# Patient Record
Sex: Female | Born: 1990 | Race: Black or African American | Hispanic: No | Marital: Single | Smoking: Current some day smoker
Health system: Southern US, Community
[De-identification: ages and names within clinical notes are randomized; demographics above are authoritative.]

## PROBLEM LIST (undated history)

## (undated) ENCOUNTER — Ambulatory Visit (HOSPITAL_COMMUNITY): Admission: EM | Payer: No Typology Code available for payment source

---

## 2012-07-28 ENCOUNTER — Encounter (HOSPITAL_COMMUNITY): Payer: Self-pay | Admitting: *Deleted

## 2012-07-28 ENCOUNTER — Emergency Department (HOSPITAL_COMMUNITY): Payer: Medicaid Other

## 2012-07-28 ENCOUNTER — Emergency Department (HOSPITAL_COMMUNITY)
Admission: EM | Admit: 2012-07-28 | Discharge: 2012-07-28 | Disposition: A | Discharge status: Home / Self Care | Payer: Medicaid Other | Attending: Emergency Medicine | Admitting: Emergency Medicine

## 2012-07-28 DIAGNOSIS — R51 Headache: Secondary | ICD-10-CM | POA: Insufficient documentation

## 2012-07-28 DIAGNOSIS — R0602 Shortness of breath: Secondary | ICD-10-CM | POA: Insufficient documentation

## 2012-07-28 DIAGNOSIS — B349 Viral infection, unspecified: Secondary | ICD-10-CM

## 2012-07-28 DIAGNOSIS — B9789 Other viral agents as the cause of diseases classified elsewhere: Secondary | ICD-10-CM | POA: Insufficient documentation

## 2012-07-28 DIAGNOSIS — R5381 Other malaise: Secondary | ICD-10-CM | POA: Insufficient documentation

## 2012-07-28 DIAGNOSIS — R110 Nausea: Secondary | ICD-10-CM | POA: Insufficient documentation

## 2012-07-28 DIAGNOSIS — J3489 Other specified disorders of nose and nasal sinuses: Secondary | ICD-10-CM | POA: Insufficient documentation

## 2012-07-28 DIAGNOSIS — M549 Dorsalgia, unspecified: Secondary | ICD-10-CM | POA: Insufficient documentation

## 2012-07-28 DIAGNOSIS — R05 Cough: Secondary | ICD-10-CM | POA: Insufficient documentation

## 2012-07-28 DIAGNOSIS — R059 Cough, unspecified: Secondary | ICD-10-CM | POA: Insufficient documentation

## 2012-07-28 DIAGNOSIS — R6883 Chills (without fever): Secondary | ICD-10-CM | POA: Insufficient documentation

## 2012-07-28 DIAGNOSIS — Z3202 Encounter for pregnancy test, result negative: Secondary | ICD-10-CM | POA: Insufficient documentation

## 2012-07-28 DIAGNOSIS — J029 Acute pharyngitis, unspecified: Secondary | ICD-10-CM | POA: Insufficient documentation

## 2012-07-28 LAB — CBC WITH DIFFERENTIAL/PLATELET
Basophils Relative: 1 % (ref 0–1)
Eosinophils Absolute: 0.2 10*3/uL (ref 0.0–0.7)
Lymphs Abs: 1.2 10*3/uL (ref 0.7–4.0)
MCH: 29.6 pg (ref 26.0–34.0)
Neutrophils Relative %: 60 % (ref 43–77)
Platelets: 215 10*3/uL (ref 150–400)
RBC: 4.15 MIL/uL (ref 3.87–5.11)
WBC: 4.5 10*3/uL (ref 4.0–10.5)

## 2012-07-28 LAB — BASIC METABOLIC PANEL
GFR calc Af Amer: >90 mL/min (ref >90)
GFR calc non Af Amer: >90 mL/min (ref >90)
Glucose, Bld: 84 mg/dL (ref 70–99)
Potassium: 3.6 mEq/L (ref 3.5–5.1)
Sodium: 140 mEq/L (ref 135–145)

## 2012-07-28 LAB — URINALYSIS, ROUTINE W REFLEX MICROSCOPIC
Bilirubin Urine: NEGATIVE
Glucose, UA: NEGATIVE mg/dL
Hgb urine dipstick: NEGATIVE
Ketones, ur: NEGATIVE mg/dL
Protein, ur: NEGATIVE mg/dL

## 2012-07-28 LAB — URINE MICROSCOPIC-ADD ON

## 2012-07-28 LAB — POCT PREGNANCY, URINE: Preg Test, Ur: NEGATIVE

## 2012-07-28 LAB — MONONUCLEOSIS SCREEN: Mono Screen: NEGATIVE

## 2012-07-28 MED ORDER — HYDROCODONE-ACETAMINOPHEN 5-325 MG PO TABS
1.0000 | ORAL_TABLET | Freq: Once | ORAL | Status: AC
  Administered 2012-07-28: 1 via ORAL
  Filled 2012-07-28: qty 1

## 2012-07-28 MED ORDER — ONDANSETRON HCL 4 MG/2ML IJ SOLN: 4.0000 mg | Freq: Once | INTRAMUSCULAR | Status: DC

## 2012-07-28 MED ORDER — MORPHINE SULFATE 4 MG/ML IJ SOLN: 4.0000 mg | Freq: Once | INTRAMUSCULAR | Status: DC

## 2012-07-28 MED ORDER — ONDANSETRON 4 MG PO TBDP
4.0000 mg | ORAL_TABLET | Freq: Once | ORAL | Status: AC
  Administered 2012-07-28: 4 mg via ORAL
  Filled 2012-07-28: qty 1

## 2012-07-28 NOTE — ED Provider Notes (Signed)
History     CSN: [id number]  Arrival date & time 07/28/12  1407   First MD Initiated Contact with Patient 07/28/12 1528     Chief Complaint  Patient presents with  . Abdominal Pain   HPI: Alyssa Wells is a 21 yo AAF with no pertinent history presents with intermittent abdominal pain, headache, nasal congestion, sore throat and cough. Symptoms started two weeks ago with nasal congestion, sore throat and mild intermittent headache. She also endorses more fatigue since this period of time. She has had a non-productive cough. She continues to tolerate PO with some intermittent nausea but no vomiting. Over the last several days she has had intermittent LUQ pain she describes as sharp, lasts 5 minutes then resolves spontaneously. She denies vaginal discharge or vaginal bleeding that is abnormal. Further no syncopal or near-syncopal episodes.   No past medical history on file.  No past surgical history on file.  No family history on file.  History  Substance Use Topics  . Smoking status: Not on file  . Smokeless tobacco: Not on file  . Alcohol Use: Not on file    OB History    No data available      Review of Systems  Constitutional: Positive for chills and fatigue. Negative for fever and appetite change.  HENT: Positive for congestion (nasal) and sore throat. Negative for rhinorrhea and trouble swallowing.   Eyes: Negative for photophobia and visual disturbance.  Respiratory: Positive for cough. Negative for chest tightness, shortness of breath and wheezing.   Cardiovascular: Positive for chest pain. Negative for palpitations and leg swelling.  Gastrointestinal: Positive for nausea and abdominal pain. Negative for vomiting and constipation.  Genitourinary: Negative for dysuria, urgency and decreased urine volume.  Musculoskeletal: Negative for myalgias, back pain and arthralgias.  Skin: Negative for pallor and rash.  Neurological: Negative for dizziness, weakness and headaches.   Psychiatric/Behavioral: Negative for confusion and agitation.    Allergies  Review of patient's allergies indicates not on file.  Home Medications  No current outpatient prescriptions on file.  BP 132/81  Pulse 90  Temp 98.1 F (36.7 C) (Oral)  Resp 18  SpO2 98%  LMP 05/04/2012  Physical Exam  Nursing note and vitals reviewed. Constitutional: She is oriented to person, place, and time. She appears well-developed and well-nourished. She is cooperative. No distress.  HENT:  Head: Normocephalic and atraumatic.  Nose: Mucosal edema present. Right sinus exhibits no maxillary sinus tenderness and no frontal sinus tenderness. Left sinus exhibits no maxillary sinus tenderness and no frontal sinus tenderness.  Mouth/Throat: Mucous membranes are normal. Posterior oropharyngeal erythema (mild) present.  Eyes: Conjunctivae normal and EOM are normal. Pupils are equal, round, and reactive to light.  Neck: Normal range of motion. Neck supple. No JVD present.  Cardiovascular: Normal rate, regular rhythm, S1 normal, S2 normal and normal heart sounds.  Exam reveals no decreased pulses.   Pulmonary/Chest: Effort normal and breath sounds normal. She has no decreased breath sounds.  Abdominal: Soft. Normal appearance and bowel sounds are normal. There is tenderness in the left upper quadrant. There is no rebound, no guarding and no CVA tenderness.  Musculoskeletal: Normal range of motion. She exhibits no edema.  Neurological: She is alert and oriented to person, place, and time. No cranial nerve deficit.  Skin: Skin is warm and dry.   ED Course  Procedures  Labs Reviewed  URINALYSIS, ROUTINE W REFLEX MICROSCOPIC - Abnormal; Notable for the following:    Urobilinogen, UA 2.0 (*)  Leukocytes, UA TRACE (*)     All other components within normal limits  POCT PREGNANCY, URINE  URINE MICROSCOPIC-ADD ON  MONONUCLEOSIS SCREEN  CBC WITH DIFFERENTIAL  BASIC METABOLIC PANEL   MDM  21 yo AAF with  no pertinent history presents with intermittent abdominal pain, headache, nasal congestion, sore throat and cough. UA not c/w infection as no WBC or bacteria in UA. Pregnancy test negative. Mild TTP in LUQ but no peritoneal signs, no imaging required. No lower abdominal symptoms, discharge or bleeding to suggest PID. Doubt PE as low risk by Wells', not tachycardic or hypoxic. Labs without abnormality. Mono spot negative. CXR without acute disease. Obtained ECG due to intermittent CP, no WPW, prolonged QT or Brugada. Chest pain likely MSK from coughing. Symptoms likely viral in nature as she has had URI symptoms. Doubt strep as no exudates, LAD, fever, plus she has cough. No further w/u indicated in the ED. Advised symptomatic treatment and follow-up with her PCP in 3-5 days if symptoms persist. Return precautions given  ECG: SB, rate 58, normal axis, normal ECG. No previous for comparison.   Reviewed labs, imaging and ECG, utilized MDM  Discussed case with Dr. Ethelda Chick  Clinical Impression 1. Viral illness      Margie Billet, MD 07/28/12 [phone]

## 2012-07-28 NOTE — ED Provider Notes (Signed)
Complains of sore throat headache slight cough, for 2 weeks admits to nasal congestion denies shortness of breath treated with Tylenol. Also complains of left-sided abdominal pain intermittently approximately 2 weeks . Abdominal pain lasts for possibly 5 minutes at a time worse with lying in certain positions and improved with other positions pain is minimal at present on exam patient is alert nontoxic appearing. HEENT exam positive nasal congestion oropharynx is reddened no exudate uvula midline neck supple lungs clear auscultation heart regular rate and rhythm abdomen minimally tender left upper and left lower quadrants no guarding no splenomegaly  Doug Sou, MD 07/28/12 1559

## 2012-07-28 NOTE — ED Notes (Signed)
Pt. Stated, I've had abd. Pain along with a headache, back pain and some short of breath pain for 2 weeks.

## 2012-07-29 NOTE — ED Provider Notes (Signed)
I have personally seen and examined the patient.  I have discussed the plan of care with the resident.  I have reviewed the documentation on PMH/FH/Soc. History.  I have reviewed the documentation of the resident and agree.  Doug Sou, MD 07/29/12 [phone]

## 2013-01-03 ENCOUNTER — Encounter (HOSPITAL_COMMUNITY): Payer: Self-pay | Admitting: Emergency Medicine

## 2013-01-03 DIAGNOSIS — Z3202 Encounter for pregnancy test, result negative: Secondary | ICD-10-CM | POA: Insufficient documentation

## 2013-01-03 DIAGNOSIS — R197 Diarrhea, unspecified: Secondary | ICD-10-CM | POA: Insufficient documentation

## 2013-01-03 LAB — URINALYSIS, ROUTINE W REFLEX MICROSCOPIC
Nitrite: NEGATIVE
Specific Gravity, Urine: 1.033 — ABNORMAL HIGH (ref 1.005–1.030)
Urobilinogen, UA: 1 mg/dL (ref 0.0–1.0)

## 2013-01-03 LAB — URINE MICROSCOPIC-ADD ON

## 2013-01-03 LAB — COMPREHENSIVE METABOLIC PANEL
ALT: 12 U/L (ref 0–35)
Alkaline Phosphatase: 50 U/L (ref 39–117)
CO2: 28 mEq/L (ref 19–32)
GFR calc Af Amer: 79 mL/min — ABNORMAL LOW (ref >90)
GFR calc non Af Amer: 68 mL/min — ABNORMAL LOW (ref >90)
Glucose, Bld: 90 mg/dL (ref 70–99)
Potassium: 4.2 mEq/L (ref 3.5–5.1)
Sodium: 141 mEq/L (ref 135–145)
Total Protein: 8.3 g/dL (ref 6.0–8.3)

## 2013-01-03 LAB — CBC WITH DIFFERENTIAL/PLATELET
Lymphocytes Relative: 16 % (ref 12–46)
Lymphs Abs: 1.1 10*3/uL (ref 0.7–4.0)
Neutrophils Relative %: 78 % — ABNORMAL HIGH (ref 43–77)
Platelets: 219 10*3/uL (ref 150–400)
RBC: 4.65 MIL/uL (ref 3.87–5.11)
WBC: 7.2 10*3/uL (ref 4.0–10.5)

## 2013-01-03 NOTE — ED Notes (Signed)
PT. REPORTS EMESIS ,  DIARRHEA , CHILLS AND LEFT LOWER ABDOMINAL CRAMPING ONSET YESTERDAY .

## 2013-01-03 NOTE — ED Notes (Signed)
Pt requesting d/c paperwork, stating she has to go to work and needs a note.  Informed patient d/c papers are provided only if the patient is seen by a doctor.  Pt requesting to speak to someone else about getting paperwork.  Shary Key, RN aware and spoke with patient also.

## 2013-01-03 NOTE — ED Notes (Signed)
Pt came up to nurse first and asked about wait time and how many pt in front of her. Pt told that she still had several people in front of her, to which she replied that she needed to be at work by midnight and was going to leave.

## 2013-01-04 ENCOUNTER — Emergency Department (HOSPITAL_COMMUNITY)
Admission: EM | Admit: 2013-01-04 | Discharge: 2013-01-04 | Disposition: A | Discharge status: Home / Self Care | Payer: Medicaid Other | Attending: Emergency Medicine | Admitting: Emergency Medicine

## 2013-01-04 NOTE — ED Notes (Signed)
Pt called for vital recheck with no answer 

## 2013-01-05 LAB — URINE CULTURE

## 2013-01-06 NOTE — ED Notes (Signed)
Post ED Visit - Positive Culture Follow-up: Successful Patient Follow-Up  Culture assessed and recommendations reviewed by: []  Wes Dulaney, Pharm.D., BCPS []  Celedonio Miyamoto, Pharm.D., BCPS []  Georgina Pillion, Pharm.D., BCPS [x]  [place], [address].D., BCPS, AAHIVP []  Estella Husk, Pharm.D., BCPS, AAHIVP  Positive urine culture  [x]  Patient discharged without antimicrobial prescription and treatment is now indicated []  Organism is resistant to prescribed ED discharge antimicrobial []  Patient with positive blood cultures  Changes discussed with ED provider: Roxy Horseman New antibiotic prescription Keflex 500 mg TID x 5 days    Contacted patient: No answer  Larena Sox 01/06/2013, 6:01 PM

## 2013-01-06 NOTE — Progress Notes (Signed)
ED Antimicrobial Stewardship Positive Culture Follow Up   Alyssa Wells is an 22 y.o. female who presented to [facility] on 01/04/2013 with a chief complaint of nausea, abd pain Chief Complaint  Patient presents with  . Emesis  . Diarrhea    Recent Results (from the past 720 hour(s))  URINE CULTURE     Status: None   Collection Time    01/03/13  8:36 PM      Result Value Range Status   Specimen Description URINE, CLEAN CATCH   Final   Special Requests NONE   Final   Culture  Setup Time 01/04/2013 03:08   Final   Colony Count 40,000 COLONIES/ML   Final   Culture     Final   Value: GROUP B STREP(S.AGALACTIAE)ISOLATED     Note: TESTING AGAINST S. AGALACTIAE NOT ROUTINELY PERFORMED DUE TO PREDICTABILITY OF AMP/PEN/VAN SUSCEPTIBILITY.   Report Status 01/05/2013 FINAL   Final    []  Treated with , organism resistant to prescribed antimicrobial [x]  Patient discharged originally without antimicrobial agent and treatment is now indicated. Her UA show turbid appearance. She was not seen by MD before dc. Flow manager will call pt and if she is symptomatic then keflex 500mg  TID x5 days will be prescribed.     ED Provider: Ivar Drape   Ulyses Southward [place] 01/06/2013, 12:13 PM Infectious Diseases Pharmacist Phone# [phone]

## 2013-01-07 ENCOUNTER — Telehealth (HOSPITAL_COMMUNITY): Payer: Self-pay | Admitting: Emergency Medicine

## 2013-03-13 ENCOUNTER — Emergency Department (HOSPITAL_COMMUNITY): Payer: Medicaid Other

## 2013-03-13 ENCOUNTER — Encounter (HOSPITAL_COMMUNITY): Payer: Self-pay | Admitting: *Deleted

## 2013-03-13 ENCOUNTER — Emergency Department (HOSPITAL_COMMUNITY)
Admission: EM | Admit: 2013-03-13 | Discharge: 2013-03-13 | Discharge status: Against Medical Advice | Payer: Medicaid Other | Attending: Emergency Medicine | Admitting: Emergency Medicine

## 2013-03-13 DIAGNOSIS — Y92410 Unspecified street and highway as the place of occurrence of the external cause: Secondary | ICD-10-CM | POA: Insufficient documentation

## 2013-03-13 DIAGNOSIS — S298XXA Other specified injuries of thorax, initial encounter: Secondary | ICD-10-CM | POA: Insufficient documentation

## 2013-03-13 DIAGNOSIS — Y939 Activity, unspecified: Secondary | ICD-10-CM | POA: Insufficient documentation

## 2013-03-13 NOTE — ED Notes (Signed)
UNABLE TO LOCATE PT 
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
## 2013-03-13 NOTE — ED Notes (Signed)
Pt returned from xray

## 2014-04-12 ENCOUNTER — Emergency Department (HOSPITAL_COMMUNITY)
Admission: EM | Admit: 2014-04-12 | Discharge: 2014-04-13 | Disposition: A | Discharge status: Home / Self Care | Payer: No Typology Code available for payment source | Attending: Emergency Medicine | Admitting: Emergency Medicine

## 2014-04-12 ENCOUNTER — Encounter (HOSPITAL_COMMUNITY): Payer: Self-pay | Admitting: Emergency Medicine

## 2014-04-12 DIAGNOSIS — Z79899 Other long term (current) drug therapy: Secondary | ICD-10-CM | POA: Insufficient documentation

## 2014-04-12 DIAGNOSIS — R51 Headache: Secondary | ICD-10-CM | POA: Insufficient documentation

## 2014-04-12 DIAGNOSIS — Z3202 Encounter for pregnancy test, result negative: Secondary | ICD-10-CM | POA: Insufficient documentation

## 2014-04-12 DIAGNOSIS — R112 Nausea with vomiting, unspecified: Secondary | ICD-10-CM | POA: Insufficient documentation

## 2014-04-12 DIAGNOSIS — R519 Headache, unspecified: Secondary | ICD-10-CM

## 2014-04-12 DIAGNOSIS — F17200 Nicotine dependence, unspecified, uncomplicated: Secondary | ICD-10-CM | POA: Insufficient documentation

## 2014-04-12 NOTE — ED Notes (Signed)
Pt c/o migraine with n/v starting today while at work. Emesis x 1.

## 2014-04-13 LAB — I-STAT CHEM 8, ED
BUN: 7 mg/dL (ref 6–23)
CHLORIDE: 103 meq/L (ref 96–112)
Calcium, Ion: 1.2 mmol/L (ref 1.12–1.23)
Creatinine, Ser: 0.8 mg/dL (ref 0.50–1.10)
Glucose, Bld: 108 mg/dL — ABNORMAL HIGH (ref 70–99)
HEMATOCRIT: 35 % — AB (ref 36.0–46.0)
HEMOGLOBIN: 11.9 g/dL — AB (ref 12.0–15.0)
POTASSIUM: 3.3 meq/L — AB (ref 3.7–5.3)
Sodium: 139 mEq/L (ref 137–147)
TCO2: 25 mmol/L (ref 0–100)

## 2014-04-13 LAB — POC URINE PREG, ED: PREG TEST UR: NEGATIVE

## 2014-04-13 MED ORDER — NAPROXEN 500 MG PO TABS: 500.0000 mg | ORAL_TABLET | Freq: Two times a day (BID) | ORAL | Status: DC

## 2014-04-13 MED ORDER — METOCLOPRAMIDE HCL 5 MG/ML IJ SOLN
10.0000 mg | Freq: Once | INTRAMUSCULAR | Status: AC
  Administered 2014-04-13: 10 mg via INTRAVENOUS
  Filled 2014-04-13: qty 2

## 2014-04-13 MED ORDER — SODIUM CHLORIDE 0.9 % IV BOLUS (SEPSIS)
1000.0000 mL | Freq: Once | INTRAVENOUS | Status: AC
  Administered 2014-04-13: 1000 mL via INTRAVENOUS

## 2014-04-13 MED ORDER — KETOROLAC TROMETHAMINE 30 MG/ML IJ SOLN
30.0000 mg | Freq: Once | INTRAMUSCULAR | Status: AC
  Administered 2014-04-13: 30 mg via INTRAVENOUS
  Filled 2014-04-13: qty 1

## 2014-04-13 MED ORDER — MORPHINE SULFATE 4 MG/ML IJ SOLN
4.0000 mg | Freq: Once | INTRAMUSCULAR | Status: AC
  Administered 2014-04-13: 4 mg via INTRAVENOUS
  Filled 2014-04-13: qty 1

## 2014-04-13 NOTE — ED Provider Notes (Signed)
CSN: [id number]     Arrival date & time 04/12/14  2325 History   First MD Initiated Contact with Patient 04/12/14 2334     Chief Complaint  Patient presents with  . Emesis  . Migraine      The history is provided by the patient.   patient presents to emergency department with nausea and vomiting and headache which began today.  She denies fevers or chills.  She denies neck pain or neck stiffness.  No recent illness.  No recent trauma to her head.  She states she has a history of headaches but this is more severe than usual.  She has photophobia and phonophobia.  She states normally she will try Tylenol with resolution of her headache.  Tonight this did not work.  She presents the emergency department for evaluation.  She reports irregular menstrual cycles and reports that is normal for her.  She denies abdominal pain.  No active vaginal bleeding.  Denies chest pain.  No shortness of breath.  No other complaints.  No new rash.    No past medical history on file. No past surgical history on file. No family history on file. History  Substance Use Topics  . Smoking status: Current Every Day Smoker -- 0.50 packs/day    Types: Cigars  . Smokeless tobacco: Not on file  . Alcohol Use: No   OB History   Grav Para Term Preterm Abortions TAB SAB Ect Mult Living                 Review of Systems  Gastrointestinal: Positive for vomiting.  All other systems reviewed and are negative.     Allergies  Strawberry  Home Medications   Prior to Admission medications   Medication Sig Start Date End Date Taking? Authorizing Provider  acetaminophen (TYLENOL) 500 MG tablet Take 1,000 mg by mouth every 6 (six) hours as needed for headache.    Yes Historical Provider, MD  Prenatal Vit-Fe Fumarate-FA (MULTIVITAMIN-PRENATAL) 27-0.8 MG TABS Take 1 tablet by mouth daily at 12 noon.   Yes Historical Provider, MD   BP 131/75  Pulse 98  Temp(Src) 98.7 F (37.1 C) (Oral)  Resp 16  Ht  (1.626 m)   Wt 120 lb (54.432 kg)  BMI 20.59 kg/m2  SpO2 98%  LMP 03/19/2014 Physical Exam  Nursing note and vitals reviewed. Constitutional: She is oriented to person, place, and time. She appears well-developed and well-nourished. No distress.  HENT:  Head: Normocephalic and atraumatic.  Eyes: EOM are normal. Pupils are equal, round, and reactive to light.  Neck: Normal range of motion.  Cardiovascular: Normal rate, regular rhythm and normal heart sounds.   Pulmonary/Chest: Effort normal and breath sounds normal.  Abdominal: Soft. She exhibits no distension. There is no tenderness.  Musculoskeletal: Normal range of motion.  Neurological: She is alert and oriented to person, place, and time.  5/5 strength in major muscle groups of  bilateral upper and lower extremities. Speech normal. No facial asymetry.   Skin: Skin is warm and dry.  Psychiatric: She has a normal mood and affect. Judgment normal.    ED Course  Procedures (including critical care time) Labs Review Labs Reviewed  I-STAT CHEM 8, ED - Abnormal; Notable for the following:    Potassium 3.3 (*)    Glucose, Bld 108 (*)    Hemoglobin 11.9 (*)    HCT 35.0 (*)    All other components within normal limits  POC URINE PREG, ED  Imaging Review No results found.   EKG Interpretation None      MDM   Final diagnoses:  Headache, unspecified headache type    Patient feels much better this time.  Acute headache.  Doubt subarachnoid hemorrhage.  Well-appearing.  Discharge home in good condition.    Lyanne Co, MD 04/13/14 [phone]

## 2014-04-13 NOTE — ED Notes (Signed)
Nad at this time.

## 2014-05-10 ENCOUNTER — Emergency Department (HOSPITAL_COMMUNITY)
Admission: EM | Admit: 2014-05-10 | Discharge: 2014-05-10 | Disposition: A | Discharge status: Home / Self Care | Payer: No Typology Code available for payment source | Attending: Emergency Medicine | Admitting: Emergency Medicine

## 2014-05-10 ENCOUNTER — Encounter (HOSPITAL_COMMUNITY): Payer: Self-pay | Admitting: Emergency Medicine

## 2014-05-10 DIAGNOSIS — H6001 Abscess of right external ear: Secondary | ICD-10-CM

## 2014-05-10 DIAGNOSIS — Z791 Long term (current) use of non-steroidal anti-inflammatories (NSAID): Secondary | ICD-10-CM | POA: Insufficient documentation

## 2014-05-10 DIAGNOSIS — H60399 Other infective otitis externa, unspecified ear: Secondary | ICD-10-CM | POA: Insufficient documentation

## 2014-05-10 DIAGNOSIS — H9209 Otalgia, unspecified ear: Secondary | ICD-10-CM | POA: Insufficient documentation

## 2014-05-10 DIAGNOSIS — Z79899 Other long term (current) drug therapy: Secondary | ICD-10-CM | POA: Insufficient documentation

## 2014-05-10 DIAGNOSIS — F17200 Nicotine dependence, unspecified, uncomplicated: Secondary | ICD-10-CM | POA: Insufficient documentation

## 2014-05-10 MED ORDER — NAPROXEN 500 MG PO TABS: 500.0000 mg | ORAL_TABLET | Freq: Two times a day (BID) | ORAL | Status: DC

## 2014-05-10 MED ORDER — SULFAMETHOXAZOLE-TRIMETHOPRIM 800-160 MG PO TABS: 1.0000 | ORAL_TABLET | Freq: Two times a day (BID) | ORAL | Status: AC

## 2014-05-10 NOTE — ED Notes (Signed)
The pt is co rt ear pain for one week.  She is having difficulty hearing from her rt ear.  She has been picking at the   ear

## 2014-05-10 NOTE — ED Provider Notes (Signed)
CSN: [id number]     Arrival date & time 05/10/14  2023 History   None    This chart was scribed for non-physician practitioner, Kerrie Buffalo NP working with Raelyn Number, DO by Arlan Organ, ED Scribe. This patient was seen in room TR11C/TR11C and the patient's care was started at 9:12 PM.   Chief Complaint  Patient presents with  . Otalgia   HPI HPI Comments: Alyssa Wells is a 23 y.o. female who presents to the Emergency Department complaining of constant, moderate R sided otalgia x 1 week that is unchanged. Pt states "it feels like there in a knot in my ear". She also reports some difficulty hearing secondary to pain. She has not tried any OTC medications or home remedies to help manage symptoms. However, she has picked at her ear several times and has noted small amounts of blood draining form the R ear. No fever, chills, cough, or congestion. 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 No known allergies to medications. She has no pertinent past medical history. No other concerns this visit.   History reviewed. No pertinent past medical history. History reviewed. No pertinent past surgical history. No family history on file. History  Substance Use Topics  . Smoking status: Current Every Day Smoker -- 0.50 packs/day    Types: Cigars  . Smokeless tobacco: Not on file  . Alcohol Use: No   OB History   Grav Para Term Preterm Abortions TAB SAB Ect Mult Living                 Review of Systems  Constitutional: Negative for fever and chills.  HENT: Positive for ear pain. Negative for congestion and sore throat.   Respiratory: Negative for cough.   all other systems negative    Allergies  Strawberry  Home Medications   Prior to Admission medications   Medication Sig Start Date End Date Taking? Authorizing Provider  acetaminophen (TYLENOL) 500 MG tablet Take 1,000 mg by mouth every 6 (six) hours as needed for  headache.     Historical Provider, MD  naproxen (NAPROSYN) 500 MG tablet Take 1 tablet (500 mg total) by mouth 2 (two) times daily. 04/13/14   Lyanne Co, MD  Prenatal Vit-Fe Fumarate-FA (MULTIVITAMIN-PRENATAL) 27-0.8 MG TABS Take 1 tablet by mouth daily at 12 noon.    Historical Provider, MD   Triage Vitals: BP 118/59  Pulse 71  Temp(Src) 98.2 F (36.8 C) (Oral)  Resp 16  Ht  (1.651 m)  Wt 130 lb (58.968 kg)  BMI 21.63 kg/m2  SpO2 100%  LMP 05/03/2014   Physical Exam  Nursing note and vitals reviewed. Constitutional: She is oriented to person, place, and time. She appears well-developed and well-nourished.  HENT:  Head: Normocephalic.  Right Ear: Tympanic membrane normal.  Left Ear: Tympanic membrane normal.  Mouth/Throat: Uvula is midline, oropharynx is clear and moist and mucous membranes are normal.  Small pustular area just inside R ear canal that is tender with a small amount of purulent drainage noted.  Eyes: Conjunctivae and EOM are normal.  Neck: Normal range of motion. Neck supple.  Cardiovascular: Normal rate, regular rhythm and normal heart sounds.   Pulmonary/Chest: Effort normal.  Musculoskeletal: Normal range of motion.  Lymphadenopathy:    She has no cervical adenopathy.  Neurological: She is alert and oriented to person, place, and time.  Skin: Skin is warm and dry.  Psychiatric: She has a normal mood and affect.    ED Course  Procedures (including critical care time)  DIAGNOSTIC STUDIES: Oxygen Saturation is 100% on RA, Normal by my interpretation.    COORDINATION OF CARE: 9:11 PM-Discussed treatment plan with pt at bedside and pt agreed to plan.     Labs Review  MDM  23 y.o. female with pustular area to the right ear canal. Will treat for infection and she will apply warm wet compresses to the area. Discussed with the patient and all questioned fully answered. She will return if any problems arise.    Medication List    TAKE these  medications       naproxen 500 MG tablet  Commonly known as:  NAPROSYN  Take 1 tablet (500 mg total) by mouth 2 (two) times daily.     sulfamethoxazole-trimethoprim 800-160 MG per tablet  Commonly known as:  BACTRIM DS,SEPTRA DS  Take 1 tablet by mouth 2 (two) times daily.      ASK your doctor about these medications       acetaminophen 500 MG tablet  Commonly known as:  TYLENOL  Take 1,000 mg by mouth every 6 (six) hours as needed for headache.     multivitamin-prenatal 27-0.8 MG Tabs tablet  Take 1 tablet by mouth daily at 12 noon.        I personally performed the services described in this documentation, which was scribed in my presence. The recorded information has been reviewed and is accurate.    [address] [place], [place] 05/11/14 [phone]

## 2014-05-10 NOTE — Discharge Instructions (Signed)
Abscess °An abscess (boil or furuncle) is an infected area on or under the skin. This area is filled with yellowish-white fluid (pus) and other material (debris). °HOME CARE  °· Only take medicines as told by your doctor. °· If you were given antibiotic medicine, take it as directed. Finish the medicine even if you start to feel better. °· If gauze is used, follow your doctor's directions for changing the gauze. °· To avoid spreading the infection: °¨ Keep your abscess covered with a bandage. °¨ Wash your hands well. °¨ Do not share personal care items, towels, or whirlpools with others. °¨ Avoid skin contact with others. °· Keep your skin and clothes clean around the abscess. °· Keep all doctor visits as told. °GET HELP RIGHT AWAY IF:  °· You have more pain, puffiness (swelling), or redness in the wound site. °· You have more fluid or blood coming from the wound site. °· You have muscle aches, chills, or you feel sick. °· You have a fever. °MAKE SURE YOU:  °· Understand these instructions. °· Will watch your condition. °· Will get help right away if you are not doing well or get worse. °Document Released: 01/24/2008 Document Revised: 02/06/2012 Document Reviewed: 10/20/2011 °ExitCare® Patient Information ©2015 [employer]. This information is not intended to replace advice given to you by your health care provider. Make sure you discuss any questions you have with your health care provider. ° °

## 2014-05-11 NOTE — ED Provider Notes (Signed)
Medical screening examination/treatment/procedure(s) were performed by non-physician practitioner and as supervising physician I was immediately available for consultation/collaboration.   EKG Interpretation None        Ndrew Creason N Kori Colin, DO 05/11/14 1435 

## 2014-07-21 ENCOUNTER — Emergency Department (HOSPITAL_COMMUNITY)
Admission: EM | Admit: 2014-07-21 | Discharge: 2014-07-21 | Disposition: A | Discharge status: Home / Self Care | Payer: No Typology Code available for payment source | Attending: Emergency Medicine | Admitting: Emergency Medicine

## 2014-07-21 ENCOUNTER — Encounter (HOSPITAL_COMMUNITY): Payer: Self-pay | Admitting: Emergency Medicine

## 2014-07-21 DIAGNOSIS — Z791 Long term (current) use of non-steroidal anti-inflammatories (NSAID): Secondary | ICD-10-CM | POA: Diagnosis not present

## 2014-07-21 DIAGNOSIS — Z720 Tobacco use: Secondary | ICD-10-CM | POA: Insufficient documentation

## 2014-07-21 DIAGNOSIS — Y998 Other external cause status: Secondary | ICD-10-CM | POA: Diagnosis not present

## 2014-07-21 DIAGNOSIS — Z79899 Other long term (current) drug therapy: Secondary | ICD-10-CM | POA: Insufficient documentation

## 2014-07-21 DIAGNOSIS — S7011XA Contusion of right thigh, initial encounter: Secondary | ICD-10-CM | POA: Insufficient documentation

## 2014-07-21 DIAGNOSIS — Y92410 Unspecified street and highway as the place of occurrence of the external cause: Secondary | ICD-10-CM | POA: Insufficient documentation

## 2014-07-21 DIAGNOSIS — S79921A Unspecified injury of right thigh, initial encounter: Secondary | ICD-10-CM | POA: Diagnosis present

## 2014-07-21 DIAGNOSIS — Y9389 Activity, other specified: Secondary | ICD-10-CM | POA: Insufficient documentation

## 2014-07-21 MED ORDER — TRAMADOL HCL 50 MG PO TABS: 50.0000 mg | ORAL_TABLET | Freq: Four times a day (QID) | ORAL | Status: DC | PRN

## 2014-07-21 MED ORDER — IBUPROFEN 600 MG PO TABS: 600.0000 mg | ORAL_TABLET | Freq: Four times a day (QID) | ORAL | Status: DC | PRN

## 2014-07-21 NOTE — Discharge Instructions (Signed)
Take ibuprofen as prescribed. Ice your leg 3-4 times per day. You may take Tramadol, in addition as needed, for severe pain. Follow up with a primary care doctor if symptoms persist.  Contusion A contusion is a deep bruise. Contusions are the result of an injury that caused bleeding under the skin. The contusion may turn blue, purple, or yellow. Minor injuries will give you a painless contusion, but more severe contusions may stay painful and swollen for a few weeks.  CAUSES  A contusion is usually caused by a blow, trauma, or direct force to an area of the body. SYMPTOMS   Swelling and redness of the injured area.  Bruising of the injured area.  Tenderness and soreness of the injured area.  Pain. DIAGNOSIS  The diagnosis can be made by taking a history and physical exam. An X-ray, CT scan, or MRI may be needed to determine if there were any associated injuries, such as fractures. TREATMENT  Specific treatment will depend on what area of the body was injured. In general, the best treatment for a contusion is resting, icing, elevating, and applying cold compresses to the injured area. Over-the-counter medicines may also be recommended for pain control. Ask your caregiver what the best treatment is for your contusion. HOME CARE INSTRUCTIONS   Put ice on the injured area.  Put ice in a plastic bag.  Place a towel between your skin and the bag.  Leave the ice on for 15-20 minutes, 3-4 times a day, or as directed by your health care provider.  Only take over-the-counter or prescription medicines for pain, discomfort, or fever as directed by your caregiver. Your caregiver may recommend avoiding anti-inflammatory medicines (aspirin, ibuprofen, and naproxen) for 48 hours because these medicines may increase bruising.  Rest the injured area.  If possible, elevate the injured area to reduce swelling. SEEK IMMEDIATE MEDICAL CARE IF:   You have increased bruising or swelling.  You have pain  that is getting worse.  Your swelling or pain is not relieved with medicines. MAKE SURE YOU:   Understand these instructions.  Will watch your condition.  Will get help right away if you are not doing well or get worse. Document Released: 05/17/2005 Document Revised: 08/12/2013 Document Reviewed: 06/12/2011 [facility] Patient Information 2015 [employer] [place], [place]. This information is not intended to replace advice given to you by your health care provider. Make sure you discuss any questions you have with your health care provider.
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
## 2017-03-22 ENCOUNTER — Ambulatory Visit (HOSPITAL_COMMUNITY)
Admission: EM | Admit: 2017-03-22 | Discharge: 2017-03-22 | Disposition: A | Discharge status: Home / Self Care | Payer: Medicaid Other | Attending: Family Medicine | Admitting: Family Medicine

## 2017-03-22 ENCOUNTER — Encounter (HOSPITAL_COMMUNITY): Payer: Self-pay | Admitting: *Deleted

## 2017-03-22 DIAGNOSIS — N3000 Acute cystitis without hematuria: Secondary | ICD-10-CM

## 2017-03-22 DIAGNOSIS — Z3202 Encounter for pregnancy test, result negative: Secondary | ICD-10-CM

## 2017-03-22 DIAGNOSIS — N3001 Acute cystitis with hematuria: Secondary | ICD-10-CM

## 2017-03-22 DIAGNOSIS — R110 Nausea: Secondary | ICD-10-CM

## 2017-03-22 DIAGNOSIS — R5383 Other fatigue: Secondary | ICD-10-CM

## 2017-03-22 LAB — POCT URINALYSIS DIP (DEVICE)
Bilirubin Urine: NEGATIVE
Glucose, UA: NEGATIVE mg/dL
HGB URINE DIPSTICK: NEGATIVE
Ketones, ur: NEGATIVE mg/dL
Nitrite: NEGATIVE
PROTEIN: NEGATIVE mg/dL
Specific Gravity, Urine: 1.02 (ref 1.005–1.030)
Urobilinogen, UA: 4 mg/dL — ABNORMAL HIGH (ref 0.0–1.0)
pH: 7 (ref 5.0–8.0)

## 2017-03-22 LAB — GLUCOSE, CAPILLARY: Glucose-Capillary: 84 mg/dL (ref 65–99)

## 2017-03-22 LAB — POCT PREGNANCY, URINE: PREG TEST UR: NEGATIVE

## 2017-03-22 MED ORDER — ONDANSETRON 4 MG PO TBDP
ORAL_TABLET | ORAL | Status: AC
  Filled 2017-03-22: qty 1

## 2017-03-22 MED ORDER — ONDANSETRON 4 MG PO TBDP: 4.0000 mg | ORAL_TABLET | Freq: Three times a day (TID) | ORAL | 0 refills | Status: DC | PRN

## 2017-03-22 MED ORDER — SULFAMETHOXAZOLE-TRIMETHOPRIM 800-160 MG PO TABS: 1.0000 | ORAL_TABLET | Freq: Two times a day (BID) | ORAL | 0 refills | Status: AC

## 2017-03-22 MED ORDER — ONDANSETRON 4 MG PO TBDP
4.0000 mg | ORAL_TABLET | Freq: Once | ORAL | Status: AC
  Administered 2017-03-22: 4 mg via ORAL

## 2017-03-22 NOTE — ED Triage Notes (Addendum)
Pt  Reports   Various    Symptoms  f  Headache     With  Nausea  And  Inability  To  Sleep  Pt  Reports   She   Has  Not  Had  A  Period  In  Several  Months      And  That  She   Is  Sometimes   Irregular    She  Ambulates   With  As  steady  Fluid  Gait    Pt  Denies  Any  ndiarrhea  Or  Vomiting  But  Reports  A  Decreased     Appetite   Pt  Also  Reports  Some  Pain in her  Chest  When  She  Coughs

## 2017-03-22 NOTE — ED Provider Notes (Signed)
  [facility]   [id number] 03/22/17 Arrival Time: 1353  ASSESSMENT & PLAN:  1. Fatigue, unspecified type   2. Acute cystitis without hematuria   3. Nausea without vomiting     Meds ordered this encounter  Medications  . ondansetron (ZOFRAN-ODT) disintegrating tablet 4 mg  . ondansetron (ZOFRAN-ODT) 4 MG disintegrating tablet    Sig: Take 1 tablet (4 mg total) by mouth every 8 (eight) hours as needed for nausea or vomiting.    Dispense:  10 tablet    Refill:  0  . sulfamethoxazole-trimethoprim (BACTRIM DS,SEPTRA DS) 800-160 MG tablet    Sig: Take 1 tablet by mouth 2 (two) times daily.    Dispense:  6 tablet    Refill:  0   Will treat for possible UTI. Culture sent. Will f/u in a few days if not showing significant improvement. Reviewed expectations re: course of current medical issues. Questions answered. Outlined signs and symptoms indicating need for more acute intervention. Patient verbalized understanding. After Visit Summary given.   SUBJECTIVE:  Alyssa Wells is a 26 y.o. female who presents with complaint of overall fatigue for one week. Mild urinary frequency. No fever. Nausea without emesis. Overall decreased PO intake. No abdominal or back discomfort. Patient's last menstrual period was 01/20/2017 (within days). No OTC treatment.  ROS: As per HPI.   OBJECTIVE:  Vitals:   03/22/17 1415  BP: 124/74  Pulse: 78  Resp: 18  Temp: 98.6 F (37 C)  TempSrc: Oral  SpO2: 100%     General appearance: alert; no distress HEENT: normocephalic; atraumatic; conjunctivae normal Neck: supple Lungs: clear to auscultation bilaterally Heart: regular rate and rhythm Abdomen: soft, non-tender; bowel sounds normal; no guarding or rebound tenderness Back: no CVA tenderness Skin: warm and dry Psychological:  alert and cooperative; normal mood and affect  Results for orders placed or performed during the hospital encounter of 03/22/17  Glucose, capillary  Result  Value Ref Range   Glucose-Capillary 84 65 - 99 mg/dL  POCT urinalysis dip (device)  Result Value Ref Range   Glucose, UA NEGATIVE NEGATIVE mg/dL   Bilirubin Urine NEGATIVE NEGATIVE   Ketones, ur NEGATIVE NEGATIVE mg/dL   Specific Gravity, Urine 1.020 1.005 - 1.030   Hgb urine dipstick NEGATIVE NEGATIVE   pH 7.0 5.0 - 8.0   Protein, ur NEGATIVE NEGATIVE mg/dL   Urobilinogen, UA 4.0 (H) 0.0 - 1.0 mg/dL   Nitrite NEGATIVE NEGATIVE   Leukocytes, UA TRACE (A) NEGATIVE  Pregnancy, urine POC  Result Value Ref Range   Preg Test, Ur NEGATIVE NEGATIVE    Labs Reviewed  POCT URINALYSIS DIP (DEVICE) - Abnormal; Notable for the following:       Result Value   Urobilinogen, UA 4.0 (*)    Leukocytes, UA TRACE (*)    All other components within normal limits  URINE CULTURE  GLUCOSE, CAPILLARY  POCT PREGNANCY, URINE    Allergies  Allergen Reactions  . Strawberry Extract Hives and Swelling    Tongue/throat with no airway blockage    PMHx, SurgHx, SocialHx, Medications, and Allergies were reviewed in the Visit Navigator and updated as appropriate.      Mardella LaymanHagler, Bellanie Matthew, MD 03/22/17 [phone]

## 2018-05-22 ENCOUNTER — Emergency Department (HOSPITAL_COMMUNITY): Admission: EM | Admit: 2018-05-22 | Discharge: 2018-05-22 | Discharge status: Against Medical Advice | Payer: Self-pay

## 2018-05-22 NOTE — ED Notes (Signed)
No answer x3 for triage.  

## 2019-09-03 ENCOUNTER — Encounter (HOSPITAL_COMMUNITY): Payer: Self-pay | Admitting: Emergency Medicine

## 2019-09-03 ENCOUNTER — Other Ambulatory Visit: Payer: Self-pay

## 2019-09-03 ENCOUNTER — Emergency Department (HOSPITAL_COMMUNITY)
Admission: EM | Admit: 2019-09-03 | Discharge: 2019-09-03 | Discharge status: Against Medical Advice | Payer: Self-pay | Attending: Emergency Medicine | Admitting: Emergency Medicine

## 2019-09-03 DIAGNOSIS — Z5321 Procedure and treatment not carried out due to patient leaving prior to being seen by health care provider: Secondary | ICD-10-CM | POA: Insufficient documentation
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
## 2019-09-03 NOTE — ED Notes (Signed)
PT called x3, no response.

## 2019-09-04 ENCOUNTER — Emergency Department (HOSPITAL_COMMUNITY): Payer: No Typology Code available for payment source

## 2019-09-04 ENCOUNTER — Encounter (HOSPITAL_COMMUNITY): Payer: Self-pay | Admitting: Emergency Medicine

## 2019-09-04 ENCOUNTER — Emergency Department (HOSPITAL_COMMUNITY)
Admission: EM | Admit: 2019-09-04 | Discharge: 2019-09-04 | Disposition: A | Discharge status: Home / Self Care | Payer: No Typology Code available for payment source | Attending: Emergency Medicine | Admitting: Emergency Medicine

## 2019-09-04 DIAGNOSIS — F17210 Nicotine dependence, cigarettes, uncomplicated: Secondary | ICD-10-CM | POA: Insufficient documentation

## 2019-09-04 DIAGNOSIS — R0782 Intercostal pain: Secondary | ICD-10-CM | POA: Insufficient documentation

## 2019-09-04 DIAGNOSIS — M545 Low back pain: Secondary | ICD-10-CM | POA: Insufficient documentation

## 2019-09-04 LAB — POC URINE PREG, ED: Preg Test, Ur: NEGATIVE

## 2019-09-04 MED ORDER — IBUPROFEN 800 MG PO TABS
800.0000 mg | ORAL_TABLET | Freq: Once | ORAL | Status: AC
  Administered 2019-09-04: 800 mg via ORAL
  Filled 2019-09-04: qty 1

## 2019-09-04 MED ORDER — METHOCARBAMOL 500 MG PO TABS: 500.0000 mg | ORAL_TABLET | Freq: Three times a day (TID) | ORAL | 0 refills | Status: AC | PRN

## 2019-09-04 MED ORDER — IBUPROFEN 800 MG PO TABS: 800.0000 mg | ORAL_TABLET | Freq: Three times a day (TID) | ORAL | 0 refills | Status: AC | PRN

## 2019-09-04 NOTE — ED Notes (Signed)
Patient transported to X-ray 

## 2019-09-04 NOTE — ED Notes (Signed)
Returned from xray
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
## 2019-09-04 NOTE — ED Notes (Signed)
Pt ambulated to restroom. 
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
## 2019-09-04 NOTE — Discharge Instructions (Addendum)
You may alternate Tylenol 1000 mg every 6 hours as needed for pain and Ibuprofen 800 mg every 8 hours as needed for pain.  Please take Ibuprofen with food.  Do not take more than 4000 mg of Tylenol (acetaminophen) in a 24 hour period.     Steps to find a Primary Care Provider (PCP):  Call [phone] or [phone] to access "[facility] Find a Doctor Service."  2.  You may also go on the [facility] website at [email or web address]  3.  [facility] also frequently accepts new patients.  [facility]  [address] [place] [address] [phone]  4.  There are also multiple [facility], [facility] and [facility] practices throughout the [place] that are frequently accepting new patients. You may find a clinic that is close to your home and contact them.  [email or web address] [phone]  [facility] [email or web address]  [email or web address] [phone]  [email or web address] [phone]  5.  Local Health Departments also can provide primary care services.  [facility]  [address] [place] [address] [phone]  [facility] [address] [place] [address] [phone]  [facility] [address]  [place] [address] [phone]   
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
## 2022-12-01 ENCOUNTER — Emergency Department (HOSPITAL_COMMUNITY)
Admission: EM | Admit: 2022-12-01 | Discharge: 2022-12-02 | Disposition: A | Discharge status: Home / Self Care | Payer: Self-pay | Attending: Emergency Medicine | Admitting: Emergency Medicine

## 2022-12-01 ENCOUNTER — Encounter (HOSPITAL_COMMUNITY): Payer: Self-pay

## 2022-12-01 ENCOUNTER — Other Ambulatory Visit: Payer: Self-pay

## 2022-12-01 ENCOUNTER — Emergency Department (HOSPITAL_COMMUNITY): Payer: Self-pay

## 2022-12-01 DIAGNOSIS — R519 Headache, unspecified: Secondary | ICD-10-CM | POA: Insufficient documentation

## 2022-12-01 DIAGNOSIS — E860 Dehydration: Secondary | ICD-10-CM | POA: Insufficient documentation

## 2022-12-01 DIAGNOSIS — R079 Chest pain, unspecified: Secondary | ICD-10-CM | POA: Insufficient documentation

## 2022-12-01 DIAGNOSIS — Z5321 Procedure and treatment not carried out due to patient leaving prior to being seen by health care provider: Secondary | ICD-10-CM | POA: Insufficient documentation

## 2022-12-01 LAB — URINALYSIS, ROUTINE W REFLEX MICROSCOPIC
Glucose, UA: NEGATIVE mg/dL
Hgb urine dipstick: NEGATIVE
Ketones, ur: NEGATIVE mg/dL
Leukocytes,Ua: NEGATIVE
Nitrite: NEGATIVE
Protein, ur: 30 mg/dL — AB
Specific Gravity, Urine: 1.03 (ref 1.005–1.030)
pH: 5 (ref 5.0–8.0)

## 2022-12-01 LAB — BASIC METABOLIC PANEL
Anion gap: 9 (ref 5–15)
BUN: 19 mg/dL (ref 6–20)
CO2: 25 mmol/L (ref 22–32)
Calcium: 9.3 mg/dL (ref 8.9–10.3)
Chloride: 105 mmol/L (ref 98–111)
Creatinine, Ser: 1.07 mg/dL — ABNORMAL HIGH (ref 0.44–1.00)
GFR, Estimated: >60 mL/min (ref >60)
Glucose, Bld: 91 mg/dL (ref 70–99)
Potassium: 3.7 mmol/L (ref 3.5–5.1)
Sodium: 139 mmol/L (ref 135–145)

## 2022-12-01 LAB — TROPONIN I (HIGH SENSITIVITY)
Troponin I (High Sensitivity): <2 ng/L (ref <18)
Troponin I (High Sensitivity): 3 ng/L (ref <18)

## 2022-12-01 LAB — CBC
HCT: 38.9 % (ref 36.0–46.0)
Hemoglobin: 13.4 g/dL (ref 12.0–15.0)
MCH: 31.7 pg (ref 26.0–34.0)
MCHC: 34.4 g/dL (ref 30.0–36.0)
MCV: 92 fL (ref 80.0–100.0)
Platelets: 247 10*3/uL (ref 150–400)
RBC: 4.23 MIL/uL (ref 3.87–5.11)
RDW: 12.2 % (ref 11.5–15.5)
WBC: 4.5 10*3/uL (ref 4.0–10.5)
nRBC: 0 % (ref 0.0–0.2)

## 2022-12-01 LAB — I-STAT BETA HCG BLOOD, ED (MC, WL, AP ONLY): I-stat hCG, quantitative: <5 m[IU]/mL (ref <5)

## 2022-12-01 NOTE — ED Provider Triage Note (Signed)
Emergency Medicine Provider Triage Evaluation Note  Alyssa Wells , a 32 y.o. female  was evaluated in triage.  Patient complains of headache and dehydration.  Reports has been going on for the past couple days.  Also says that she had some right-sided chest pain that did not radiate.  She also says that she thinks she could be hallucinating.  Says that she is hearing voices at night but she is not sure whether or not she is just talking to herself  Review of Systems  Positive:  Negative:   Physical Exam  BP 131/78 (BP Location: Right Arm)   Pulse 91   Temp 99.6 F (37.6 C)   Resp 16   Ht 5\' 7"  (1.702 m)   Wt 68 kg   SpO2 97%   BMI 23.49 kg/m  Gen:   Awake, no distress   Resp:  Normal effort  MSK:   Moves extremities without difficulty  Other:  RRR  Medical Decision Making  Medically screening exam initiated at 4:48 PM.  Appropriate orders placed.  Melani Cong was informed that the remainder of the evaluation will be completed by another provider, this initial triage assessment does not replace that evaluation, and the importance of remaining in the ED until their evaluation is complete.     Saddie Benders, PA-C 12/01/22 1656
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
## 2022-12-01 NOTE — ED Notes (Signed)
Pt called x2 with no response, moved OTF
